# Patient Record
Sex: Male | Born: 1975 | Race: White | Hispanic: No | Marital: Single | Smoking: Former smoker
Health system: Southern US, Community
[De-identification: ages and names within clinical notes are randomized; demographics above are authoritative.]

## PROBLEM LIST (undated history)

## (undated) DIAGNOSIS — F419 Anxiety disorder, unspecified: Secondary | ICD-10-CM

## (undated) DIAGNOSIS — R000 Tachycardia, unspecified: Secondary | ICD-10-CM

## (undated) DIAGNOSIS — Z87442 Personal history of urinary calculi: Secondary | ICD-10-CM

## (undated) DIAGNOSIS — G4730 Sleep apnea, unspecified: Secondary | ICD-10-CM

---

## 2001-10-19 ENCOUNTER — Encounter: Payer: Self-pay | Admitting: Emergency Medicine

## 2001-10-19 ENCOUNTER — Emergency Department (HOSPITAL_COMMUNITY): Admission: EM | Admit: 2001-10-19 | Discharge: 2001-10-19 | Payer: Self-pay | Admitting: Emergency Medicine

## 2019-05-04 ENCOUNTER — Other Ambulatory Visit: Payer: Self-pay

## 2019-05-04 ENCOUNTER — Encounter (HOSPITAL_COMMUNITY): Payer: Self-pay | Admitting: Emergency Medicine

## 2019-05-04 ENCOUNTER — Emergency Department (HOSPITAL_COMMUNITY)
Admission: EM | Admit: 2019-05-04 | Discharge: 2019-05-04 | Disposition: A | Discharge status: Home / Self Care | Payer: Self-pay | Attending: Emergency Medicine | Admitting: Emergency Medicine

## 2019-05-04 ENCOUNTER — Emergency Department (HOSPITAL_COMMUNITY): Payer: Self-pay

## 2019-05-04 DIAGNOSIS — R109 Unspecified abdominal pain: Secondary | ICD-10-CM

## 2019-05-04 DIAGNOSIS — N201 Calculus of ureter: Secondary | ICD-10-CM | POA: Insufficient documentation

## 2019-05-04 DIAGNOSIS — Z87891 Personal history of nicotine dependence: Secondary | ICD-10-CM | POA: Insufficient documentation

## 2019-05-04 HISTORY — DX: Anxiety disorder, unspecified: F41.9

## 2019-05-04 LAB — URINALYSIS, ROUTINE W REFLEX MICROSCOPIC
Bilirubin Urine: NEGATIVE
Glucose, UA: NEGATIVE mg/dL
Ketones, ur: NEGATIVE mg/dL
Leukocytes,Ua: NEGATIVE
Nitrite: NEGATIVE
Protein, ur: NEGATIVE mg/dL
Specific Gravity, Urine: 1.015 (ref 1.005–1.030)
pH: 6 (ref 5.0–8.0)

## 2019-05-04 LAB — CBC
HCT: 47.4 % (ref 39.0–52.0)
Hemoglobin: 16.4 g/dL (ref 13.0–17.0)
MCH: 31.5 pg (ref 26.0–34.0)
MCHC: 34.6 g/dL (ref 30.0–36.0)
MCV: 91 fL (ref 80.0–100.0)
Platelets: 172 10*3/uL (ref 150–400)
RBC: 5.21 MIL/uL (ref 4.22–5.81)
RDW: 11.9 % (ref 11.5–15.5)
WBC: 4.4 10*3/uL (ref 4.0–10.5)
nRBC: 0 % (ref 0.0–0.2)

## 2019-05-04 LAB — COMPREHENSIVE METABOLIC PANEL
ALT: 57 U/L — ABNORMAL HIGH (ref 0–44)
AST: 32 U/L (ref 15–41)
Albumin: 4 g/dL (ref 3.5–5.0)
Alkaline Phosphatase: 52 U/L (ref 38–126)
Anion gap: 10 (ref 5–15)
BUN: 13 mg/dL (ref 6–20)
CO2: 25 mmol/L (ref 22–32)
Calcium: 9.2 mg/dL (ref 8.9–10.3)
Chloride: 104 mmol/L (ref 98–111)
Creatinine, Ser: 1.11 mg/dL (ref 0.61–1.24)
GFR calc Af Amer: >60 mL/min (ref >60)
GFR calc non Af Amer: >60 mL/min (ref >60)
Glucose, Bld: 119 mg/dL — ABNORMAL HIGH (ref 70–99)
Potassium: 4.1 mmol/L (ref 3.5–5.1)
Sodium: 139 mmol/L (ref 135–145)
Total Bilirubin: 1.7 mg/dL — ABNORMAL HIGH (ref 0.3–1.2)
Total Protein: 7.1 g/dL (ref 6.5–8.1)

## 2019-05-04 LAB — LIPASE, BLOOD: Lipase: 33 U/L (ref 11–51)

## 2019-05-04 MED ORDER — KETOROLAC TROMETHAMINE 30 MG/ML IJ SOLN
30.0000 mg | Freq: Once | INTRAMUSCULAR | Status: AC
  Administered 2019-05-04: 30 mg via INTRAVENOUS
  Filled 2019-05-04: qty 1

## 2019-05-04 MED ORDER — OXYCODONE-ACETAMINOPHEN 5-325 MG PO TABS: 1.0000 | ORAL_TABLET | Freq: Four times a day (QID) | ORAL | 0 refills | Status: DC | PRN

## 2019-05-04 MED ORDER — KETOROLAC TROMETHAMINE 10 MG PO TABS: 10.0000 mg | ORAL_TABLET | Freq: Four times a day (QID) | ORAL | 0 refills | Status: DC | PRN

## 2019-05-04 MED ORDER — TAMSULOSIN HCL 0.4 MG PO CAPS: 0.4000 mg | ORAL_CAPSULE | Freq: Every day | ORAL | 0 refills | Status: AC

## 2019-05-04 MED ORDER — SODIUM CHLORIDE 0.9% FLUSH: 3.0000 mL | Freq: Once | INTRAVENOUS | Status: DC

## 2019-05-04 NOTE — Discharge Instructions (Signed)
You have been seen in the Emergency Department (ED) today for pain that we believe based on your workup, is caused by kidney stones.  As we have discussed, please drink plenty of fluids.  Please make a follow up appointment with the physician(s) listed elsewhere in this documentation.  You may take pain medication as needed but ONLY as prescribed.  Please also take your prescribed Flomax daily.  We also recommend that you take over-the-counter ibuprofen regularly according to label instructions over the next 5 days.  Take it with meals to minimize stomach discomfort.  Please see your doctor as soon as possible as stones may take 1-3 weeks to pass and you may require additional care or medications.  Do not drink alcohol, drive or participate in any other potentially dangerous activities while taking opiate pain medication as it may make you sleepy. Do not take this medication with any other sedating medications, either prescription or over-the-counter. If you were prescribed Percocet or Vicodin, do not take these with acetaminophen (Tylenol) as it is already contained within these medications.   Take Percocet as needed for severe pain.  This medication is an opiate (or narcotic) pain medication and can be habit forming.  Use it as little as possible to achieve adequate pain control.  Do not use or use it with extreme caution if you have a history of opiate abuse or dependence. This medication is intended for your use only - do not give any to anyone else and keep it in a secure place where nobody else, especially children, have access to it.  It will also cause or worsen constipation, so you may want to consider taking an over-the-counter stool softener while you are taking this medication.  Return to the Emergency Department (ED) or call your doctor if you have any worsening pain, fever, painful urination, are unable to urinate, or develop other symptoms that concern you.    Kidney Stones Kidney  stones (urolithiasis) are deposits that form inside your kidneys. The intense pain is caused by the stone moving through the urinary tract. When the stone moves, the ureter goes into spasm around the stone. The stone is usually passed in the urine.  CAUSES  A disorder that makes certain neck glands produce too much parathyroid hormone (primary hyperparathyroidism). A buildup of uric acid crystals, similar to gout in your joints. Narrowing (stricture) of the ureter. A kidney obstruction present at birth (congenital obstruction). Previous surgery on the kidney or ureters. Numerous kidney infections. SYMPTOMS  Feeling sick to your stomach (nauseous). Throwing up (vomiting). Blood in the urine (hematuria). Pain that usually spreads (radiates) to the groin. Frequency or urgency of urination. DIAGNOSIS  Taking a history and physical exam. Blood or urine tests. CT scan. Occasionally, an examination of the inside of the urinary bladder (cystoscopy) is performed. TREATMENT  Observation. Increasing your fluid intake. Extracorporeal shock wave lithotripsy--This is a noninvasive procedure that uses shock waves to break up kidney stones. Surgery may be needed if you have severe pain or persistent obstruction. There are various surgical procedures. Most of the procedures are performed with the use of small instruments. Only small incisions are needed to accommodate these instruments, so recovery time is minimized. The size, location, and chemical composition are all important variables that will determine the proper choice of action for you. Talk to your health care provider to better understand your situation so that you will minimize the risk of injury to yourself and your kidney.  HOME CARE INSTRUCTIONS  Drink enough water and fluids to keep your urine clear or pale yellow. This will help you to pass the stone or stone fragments. Strain all urine through the provided strainer. Keep all particulate  matter and stones for your health care provider to see. The stone causing the pain may be as small as a grain of salt. It is very important to use the strainer each and every time you pass your urine. The collection of your stone will allow your health care provider to analyze it and verify that a stone has actually passed. The stone analysis will often identify what you can do to reduce the incidence of recurrences. Only take over-the-counter or prescription medicines for pain, discomfort, or fever as directed by your health care provider. Keep all follow-up visits as told by your health care provider. This is important. Get follow-up X-rays if required. The absence of pain does not always mean that the stone has passed. It may have only stopped moving. If the urine remains completely obstructed, it can cause loss of kidney function or even complete destruction of the kidney. It is your responsibility to make sure X-rays and follow-ups are completed. Ultrasounds of the kidney can show blockages and the status of the kidney. Ultrasounds are not associated with any radiation and can be performed easily in a matter of minutes. Make changes to your daily diet as told by your health care provider. You may be told to: Limit the amount of salt that you eat. Eat 5 or more servings of fruits and vegetables each day. Limit the amount of meat, poultry, fish, and eggs that you eat. Collect a 24-hour urine sample as told by your health care provider. You may need to collect another urine sample every 6-12 months. SEEK MEDICAL CARE IF: You experience pain that is progressive and unresponsive to any pain medicine you have been prescribed. SEEK IMMEDIATE MEDICAL CARE IF:  Pain cannot be controlled with the prescribed medicine. You have a fever or shaking chills. The severity or intensity of pain increases over 18 hours and is not relieved by pain medicine. You develop a new onset of abdominal pain. You feel faint  or pass out. You are unable to urinate.   This information is not intended to replace advice given to you by your health care provider. Make sure you discuss any questions you have with your health care provider.   Document Released: 07/06/2005 Document Revised: 03/27/2015 Document Reviewed: 12/07/2012 Elsevier Interactive Patient Education [employer].

## 2019-05-04 NOTE — ED Notes (Signed)
Returned From CT

## 2019-05-04 NOTE — ED Triage Notes (Signed)
Pt reports sudden onset of left flank pain about an hour ago. Never had this pain before. Denies recent fever, vomiting, diarrhea. NAD at present.

## 2019-05-04 NOTE — ED Notes (Signed)
Transported to CT 

## 2019-05-04 NOTE — ED Provider Notes (Signed)
Emergency Department Provider Note   I have reviewed the triage vital signs and the nursing notes.   HISTORY  Chief Complaint Flank Pain   HPI Edwin Chen is a 43 y.o. male with PMH of anxiety presents to the emergency department for evaluation of acute onset severe left flank pain.  Patient states he got up to urinate at which point he felt severe, sharp left flank pain.  He states that he never had a similar pain in the past.  He felt an uncomfortable sensation with attempting to urinate.  He has not had fevers or chills.  No vomiting or diarrhea.  Pain radiates from the left flank to the left lower abdomen.  No prior history of kidney stone.  He has not seen blood in the urine.  After about 1 hour of severe pain his pain decreased but continues to be moderate and intermittent in the same location.  Past Medical History:  Diagnosis Date   Anxiety     There are no active problems to display for this patient.   History reviewed. No pertinent surgical history.  Allergies Patient has no known allergies.  History reviewed. No pertinent family history.  Social History Social History   Tobacco Use   Smoking status: Former Smoker  Substance Use Topics   Alcohol use: Yes    Comment: occas   Drug use: Never    Review of Systems  Constitutional: No fever/chills Eyes: No visual changes. ENT: No sore throat. Cardiovascular: Denies chest pain. Respiratory: Denies shortness of breath. Gastrointestinal: Positive left flank/abdominal pain.  No nausea, no vomiting.  No diarrhea.  No constipation. Genitourinary: Positive for dysuria. Musculoskeletal: Negative for back pain. Skin: Negative for rash. Neurological: Negative for headaches, focal weakness or numbness.  10-point ROS otherwise negative.  ____________________________________________   PHYSICAL EXAM:  VITAL SIGNS: ED Triage Vitals  Enc Vitals Group     BP 05/04/19 0729 (!) 142/101     Pulse Rate  05/04/19 0729 80     Resp 05/04/19 0729 16     Temp 05/04/19 0729 98.4 F (36.9 C)     Temp Source 05/04/19 0729 Oral     SpO2 05/04/19 0729 100 %     Weight 05/04/19 0730 220 lb (99.8 kg)     Height 05/04/19 0730 5\' 8"  (1.727 m)   Constitutional: Alert and oriented. Well appearing and in no acute distress. Eyes: Conjunctivae are normal.  Head: Atraumatic. Nose: No congestion/rhinnorhea. Mouth/Throat: Mucous membranes are moist.  Neck: No stridor.  Cardiovascular: Normal rate, regular rhythm.  Respiratory: Normal respiratory effort.  Gastrointestinal: Soft and nontender. No distention.  Musculoskeletal: No gross deformities of extremities. Neurologic:  Normal speech and language.  Skin:  Skin is warm, dry and intact. No rash noted.  ____________________________________________   LABS (all labs ordered are listed, but only abnormal results are displayed)  Labs Reviewed  COMPREHENSIVE METABOLIC PANEL - Abnormal; Notable for the following components:      Result Value   Glucose, Bld 119 (*)    ALT 57 (*)    Total Bilirubin 1.7 (*)    All other components within normal limits  URINALYSIS, ROUTINE W REFLEX MICROSCOPIC - Abnormal; Notable for the following components:   Hgb urine dipstick LARGE (*)    Bacteria, UA RARE (*)    All other components within normal limits  LIPASE, BLOOD  CBC   ____________________________________________  RADIOLOGY  Ct Renal Stone Study  Result Date: 05/04/2019 CLINICAL DATA:  Left  flank pain EXAM: CT ABDOMEN AND PELVIS WITHOUT CONTRAST TECHNIQUE: Multidetector CT imaging of the abdomen and pelvis was performed following the standard protocol without oral or IV contrast. COMPARISON:  None. FINDINGS: Lower chest: Lung bases are clear. Hepatobiliary: There is hepatic steatosis. No focal liver lesions are evident on this noncontrast enhanced study. Gallbladder wall is not appreciably thickened. There is no biliary duct dilatation. Pancreas: There is  no pancreatic mass or inflammatory focus. Spleen: No splenic lesions are evident. There is a splenule anterior to the spleen. Adrenals/Urinary Tract: Adrenals bilaterally appear normal. There is no renal mass on either side. There is mild hydronephrosis on the left. There is no hydronephrosis on the right. There is no intrarenal calculus on either side. There is a 3 mm calculus in the distal left ureter at the inferior left acetabular level. No other ureteral calculi are evident on either side. Urinary bladder is midline with wall thickness within normal limits. Stomach/Bowel: There is no appreciable bowel wall or mesenteric thickening. There is no bowel obstruction. The terminal ileum appears normal. There is no free air or portal venous air. Vascular/Lymphatic: There is no abdominal aortic aneurysm. No vascular lesions are demonstrable on this noncontrast enhanced study. There is a circumaortic left renal vein, an anatomic variant. There is no adenopathy in the abdomen or pelvis. Reproductive: There are prostatic calculi. Prostate and seminal vesicles are normal in size and contour. There is no evident pelvic mass. Other: Appendix appears normal. There is no abscess or ascites in the abdomen or pelvis. There is fat in each inguinal ring. Musculoskeletal: There are no blastic or lytic bone lesions. There is no intramuscular lesion. IMPRESSION: 1. 3 mm calculus in the distal left ureter near the ureterovesical junction with slight hydronephrosis on the left. 2. No bowel obstruction. No abscess in the abdomen or pelvis. The appendix appears normal. 3.  There is hepatic steatosis. 4.  There are several prostatic calculi. Electronically Signed   By: Lowella Grip III M.D.   On: 05/04/2019 10:39    ____________________________________________   PROCEDURES  Procedure(s) performed:   Procedures  None  ____________________________________________   INITIAL IMPRESSION / ASSESSMENT AND PLAN / ED  COURSE  Pertinent labs & imaging results that were available during my care of the patient were reviewed by me and considered in my medical decision making (see chart for details).   Patient presents to the emergency department with acute onset left flank pain.  Clinically my suspicion for kidney stone is elevated.  Patient is low risk for vascular pathology.  He has no anterior abdominal tenderness to suspect abdominal surgical etiology.  Plan for CT renal, UA, pain control, reassess.  Patient with uncomplicated left kidney stone. [place] drug database reviewed. Patient's pain is well controlled in the ED. Referral placed for Urology. Contact info given. Plan for discharge. UA without sign of infection.  ____________________________________________  FINAL CLINICAL IMPRESSION(S) / ED DIAGNOSES  Final diagnoses:  Ureterolithiasis  Left flank pain     MEDICATIONS GIVEN DURING THIS VISIT:  Medications  ketorolac (TORADOL) 30 MG/ML injection 30 mg (30 mg Intravenous Given 05/04/19 0915)     NEW OUTPATIENT MEDICATIONS STARTED DURING THIS VISIT:  Discharge Medication List as of 05/04/2019 10:57 AM    START taking these medications   Details  ketorolac (TORADOL) 10 MG tablet Take 1 tablet (10 mg total) by mouth every 6 (six) hours as needed., Starting Thu 05/04/2019, Normal    oxyCODONE-acetaminophen (PERCOCET/ROXICET) 5-325 MG tablet Take 1  tablet by mouth every 6 (six) hours as needed for severe pain., Starting Thu 05/04/2019, Normal    tamsulosin (FLOMAX) 0.4 MG CAPS capsule Take 1 capsule (0.4 mg total) by mouth daily for 14 days., Starting Thu 05/04/2019, Until Thu 05/18/2019, Normal        Note:  This document was prepared using Dragon voice recognition software and may include unintentional dictation errors.  Alona Bene, MD, [facility] Emergency Medicine    Amnah Breuer, Arlyss Repress, MD 05/04/19 [phone]

## 2019-11-09 ENCOUNTER — Ambulatory Visit (INDEPENDENT_AMBULATORY_CARE_PROVIDER_SITE_OTHER): Payer: 59 | Admitting: Orthopaedic Surgery

## 2019-11-09 ENCOUNTER — Ambulatory Visit (INDEPENDENT_AMBULATORY_CARE_PROVIDER_SITE_OTHER): Payer: 59

## 2019-11-09 ENCOUNTER — Encounter: Payer: Self-pay | Admitting: Orthopaedic Surgery

## 2019-11-09 ENCOUNTER — Other Ambulatory Visit: Payer: Self-pay

## 2019-11-09 DIAGNOSIS — G8929 Other chronic pain: Secondary | ICD-10-CM | POA: Diagnosis not present

## 2019-11-09 DIAGNOSIS — M25562 Pain in left knee: Secondary | ICD-10-CM

## 2019-11-09 MED ORDER — BUPIVACAINE HCL 0.5 % IJ SOLN
2.0000 mL | INTRAMUSCULAR | Status: AC | PRN
  Administered 2019-11-09: 2 mL via INTRA_ARTICULAR

## 2019-11-09 MED ORDER — METHYLPREDNISOLONE ACETATE 40 MG/ML IJ SUSP
40.0000 mg | INTRAMUSCULAR | Status: AC | PRN
  Administered 2019-11-09: 40 mg via INTRA_ARTICULAR

## 2019-11-09 MED ORDER — LIDOCAINE HCL 1 % IJ SOLN
2.0000 mL | INTRAMUSCULAR | Status: AC | PRN
  Administered 2019-11-09: 2 mL

## 2019-11-09 NOTE — Progress Notes (Signed)
Office Visit Note   Patient: Edwin Chen           Date of Birth: 09/10/1975           MRN: [id number] Visit Date: 11/09/2019              Requested by: Clayborn Heron, MD [address] [place],  [place] [address] PCP: Patient, No Pcp Per   Assessment & Plan: Visit Diagnoses:  1. Chronic pain of left knee     Plan: Impression is left knee pain with effusion.  I aspirated approximately 30 cc of joint fluid and then injected his knee with cortisone today.  He tolerated this well.  He will take it easy for couple days and then increase activity as tolerated.  We discussed that should this recur we will need to then obtain MRI to rule out structural abnormality particularly a medial meniscal tear.  Otherwise we will see him back as needed.  Follow-Up Instructions: Return if symptoms worsen or fail to improve.   Orders:  Orders Placed This Encounter  Procedures  . XR KNEE 3 VIEW LEFT   No orders of the defined types were placed in this encounter.     Procedures: Large Joint Inj: L knee on 11/09/2019 10:15 AM Details: 22 G needle Medications: 2 mL bupivacaine 0.5 %; 2 mL lidocaine 1 %; 40 mg methylPREDNISolone acetate 40 MG/ML Outcome: tolerated well, no immediate complications Patient was prepped and draped in the usual sterile fashion.       Clinical Data: No additional findings.   Subjective: Chief Complaint  Patient presents with  . Left Knee - Pain    Fitzpatrick is a very pleasant 44 year old gentleman comes in for evaluation of chronic left knee pain and stiffness and swelling.  Overall this has been bothering him for several months.  He went to see his PCP yesterday and was referred here.  He denies a history of gout or injuries.  The pain is getting worse and the knee feels weak and wants to give way.  He feels pressure and tightness and pain on the medial side.  Denies any numbness and tingling.   Review of Systems  Constitutional: Negative.   All other  systems reviewed and are negative.    Objective: Vital Signs: There were no vitals taken for this visit.  Physical Exam Vitals and nursing note reviewed.  Constitutional:      Appearance: He is well-developed.  HENT:     Head: Normocephalic and atraumatic.  Eyes:     Pupils: Pupils are equal, round, and reactive to light.  Pulmonary:     Effort: Pulmonary effort is normal.  Abdominal:     Palpations: Abdomen is soft.  Musculoskeletal:        General: Normal range of motion.     Cervical back: Neck supple.  Skin:    General: Skin is warm.  Neurological:     Mental Status: He is alert and oriented to person, place, and time.  Psychiatric:        Behavior: Behavior normal.        Thought Content: Thought content normal.        Judgment: Judgment normal.     Ortho Exam Left knee shows a medium sized joint effusion.  Slight limitation range of motion secondary to the effusion.  Collaterals and cruciates are stable.  Slight medial joint line tenderness.  Negative McMurray. Specialty Comments:  No specialty comments available.  Imaging:  No results found.   PMFS History: There are no problems to display for this patient.  Past Medical History:  Diagnosis Date  . Anxiety     History reviewed. No pertinent family history.  History reviewed. No pertinent surgical history. Social History   Occupational History  . Not on file  Tobacco Use  . Smoking status: Former Research scientist (life sciences)  . Smokeless tobacco: Never Used  Substance and Sexual Activity  . Alcohol use: Yes    Comment: occas  . Drug use: Never  . Sexual activity: Yes    Birth control/protection: None

## 2019-11-09 NOTE — Addendum Note (Signed)
Addended by: Albertina Parr on: 11/09/2019 04:14 PM   Modules accepted: Orders

## 2019-11-10 ENCOUNTER — Telehealth: Payer: Self-pay

## 2019-11-10 ENCOUNTER — Telehealth: Payer: Self-pay | Admitting: Orthopaedic Surgery

## 2019-11-10 LAB — SYNOVIAL CELL COUNT + DIFF, W/ CRYSTALS
Basophils, %: 0 %
Eosinophils-Synovial: 0 % (ref 0–2)
Lymphocytes-Synovial Fld: 33 % (ref 0–74)
Monocyte/Macrophage: 60 % (ref 0–69)
Neutrophil, Synovial: 1 % (ref 0–24)
Synoviocytes, %: 6 % (ref 0–15)
WBC, Synovial: 397 cells/uL — ABNORMAL HIGH (ref <150)

## 2019-11-10 LAB — TIQ-NTM

## 2019-11-10 NOTE — Telephone Encounter (Signed)
Called patient no answer. LMOM. Need to advise on message below.    Tarry Kos, MD  Albertina Parr, RMA  Fluid just shows some inflammation. No infection or gout.

## 2019-11-10 NOTE — Telephone Encounter (Signed)
Patient returned call and chelsea advised

## 2019-11-10 NOTE — Telephone Encounter (Signed)
That's was all that was needed to be relayed. Thanks.

## 2019-11-10 NOTE — Progress Notes (Signed)
Fluid just shows some inflammation.  No infection or gout.

## 2019-11-10 NOTE — Telephone Encounter (Signed)
Patient called.   He was returning the call made to him earlier. I shared the message from Roda Shutters that was in his chart. Im not sure if that was all that needed to be shared though. If there was more to inform him of, please give him a call back.   Call back: [phone]

## 2019-11-21 ENCOUNTER — Other Ambulatory Visit: Payer: Self-pay

## 2019-11-21 ENCOUNTER — Ambulatory Visit (INDEPENDENT_AMBULATORY_CARE_PROVIDER_SITE_OTHER): Payer: 59 | Admitting: Orthopaedic Surgery

## 2019-11-21 ENCOUNTER — Encounter: Payer: Self-pay | Admitting: Orthopaedic Surgery

## 2019-11-21 VITALS — Ht 68.0 in | Wt 220.0 lb

## 2019-11-21 DIAGNOSIS — G8929 Other chronic pain: Secondary | ICD-10-CM | POA: Diagnosis not present

## 2019-11-21 DIAGNOSIS — M25562 Pain in left knee: Secondary | ICD-10-CM

## 2019-11-21 NOTE — Progress Notes (Signed)
   Office Visit Note   Patient: Edwin Chen           Date of Birth: 01/01/1976           MRN: [id number] Visit Date: 11/21/2019              Requested by: No referring provider defined for this encounter. PCP: Patient, No Pcp Per   Assessment & Plan: Visit Diagnoses:  1. Chronic pain of left knee     Plan: At this point given the recurrence of the effusion and continued medial symptoms concerning for a medial meniscal tear I have ordered an MRI to evaluate for structural abnormalities.  He continues to have catching and locking symptoms.  Follow-up to review the MRI.  Follow-Up Instructions: Return for 10-14 days to review MRI.   Orders:  Orders Placed This Encounter  Procedures  . MR Knee Left w/o contrast   No orders of the defined types were placed in this encounter.     Procedures: No procedures performed   Clinical Data: No additional findings.   Subjective: Chief Complaint  Patient presents with  . Left Knee - Edema    [name] returns today for continued left knee pain.  He feels like the fluid has returned.   Review of Systems  Constitutional: Negative.   All other systems reviewed and are negative.    Objective: Vital Signs: Ht 5\' 8"  (1.727 m)   Wt 220 lb (99.8 kg)   BMI 33.45 kg/m   Physical Exam Vitals and nursing note reviewed.  Constitutional:      Appearance: He is well-developed.  Pulmonary:     Effort: Pulmonary effort is normal.  Abdominal:     Palpations: Abdomen is soft.  Skin:    General: Skin is warm.  Neurological:     Mental Status: He is alert and oriented to person, place, and time.  Psychiatric:        Behavior: Behavior normal.        Thought Content: Thought content normal.        Judgment: Judgment normal.     Ortho Exam Left knee shows a small joint effusion.  Significant medial joint line tenderness. Specialty Comments:  No specialty comments available.  Imaging: No results found.   PMFS History: There  are no problems to display for this patient.  Past Medical History:  Diagnosis Date  . Anxiety     No family history on file.  No past surgical history on file. Social History   Occupational History  . Not on file  Tobacco Use  . Smoking status: Former  . Smokeless tobacco: Never Used  Substance and Sexual Activity  . Alcohol use: Yes    Comment: occas  . Drug use: Never  . Sexual activity: Yes    Birth control/protection: None

## 2019-12-05 ENCOUNTER — Ambulatory Visit: Payer: 59 | Admitting: Orthopaedic Surgery

## 2019-12-11 ENCOUNTER — Ambulatory Visit
Admission: RE | Admit: 2019-12-11 | Discharge: 2019-12-11 | Disposition: A | Discharge status: Home / Self Care | Payer: 59 | Source: Ambulatory Visit | Attending: Orthopaedic Surgery | Admitting: Orthopaedic Surgery

## 2019-12-11 DIAGNOSIS — G8929 Other chronic pain: Secondary | ICD-10-CM

## 2019-12-13 ENCOUNTER — Encounter: Payer: Self-pay | Admitting: Orthopaedic Surgery

## 2019-12-13 ENCOUNTER — Ambulatory Visit (INDEPENDENT_AMBULATORY_CARE_PROVIDER_SITE_OTHER): Payer: 59 | Admitting: Orthopaedic Surgery

## 2019-12-13 ENCOUNTER — Other Ambulatory Visit: Payer: Self-pay

## 2019-12-13 VITALS — Ht 67.5 in | Wt 227.8 lb

## 2019-12-13 DIAGNOSIS — S83242A Other tear of medial meniscus, current injury, left knee, initial encounter: Secondary | ICD-10-CM | POA: Diagnosis not present

## 2019-12-13 MED ORDER — LIDOCAINE HCL 1 % IJ SOLN
2.0000 mL | INTRAMUSCULAR | Status: AC | PRN
  Administered 2019-12-13: 2 mL

## 2019-12-13 MED ORDER — BUPIVACAINE HCL 0.5 % IJ SOLN
2.0000 mL | INTRAMUSCULAR | Status: AC | PRN
  Administered 2019-12-13: 2 mL via INTRA_ARTICULAR

## 2019-12-13 NOTE — Progress Notes (Signed)
   Office Visit Note   Patient: Edwin Chen           Date of Birth: February 18, 1976           MRN: [id number] Visit Date: 12/13/2019              Requested by: No referring provider defined for this encounter. PCP: Patient, No Pcp Per   Assessment & Plan: Visit Diagnoses:  1. Acute medial meniscus tear of left knee, initial encounter     Plan: MRI of the left knee shows a complex tear of the medial meniscus with a small amount of bony reactive edema in the medial tibial plateau.  These findings were reviewed with the patient and given the lack of relief from conservative treatment I have recommended partial medial meniscectomy.  Based on our discussion of risk benefits rehab recovery and alternatives to surgery he has elected to proceed with a knee scope and partial medial meniscectomy.  Questions encouraged and answered.  I aspirated approximately 10 cc of joint fluid from his knee today and injected Toradol for pain relief.  Follow-Up Instructions: No follow-ups on file.   Orders:  No orders of the defined types were placed in this encounter.  No orders of the defined types were placed in this encounter.     Procedures: Large Joint Inj: L knee on 12/13/2019 3:27 PM Details: 22 G needle Medications: 2 mL bupivacaine 0.5 %; 2 mL lidocaine 1 % Outcome: tolerated well, no immediate complications Patient was prepped and draped in the usual sterile fashion.       Clinical Data: No additional findings.   Subjective: Chief Complaint  Patient presents with  . Left Knee - Pain    Vondell returns today for MRI review of the left knee.  No changes.   Review of Systems  Constitutional: Negative.   All other systems reviewed and are negative.    Objective: Vital Signs: There were no vitals taken for this visit.  Physical Exam Vitals and nursing note reviewed.  Constitutional:      Appearance: He is well-developed.  Pulmonary:     Effort: Pulmonary effort is normal.    Abdominal:     Palpations: Abdomen is soft.  Skin:    General: Skin is warm.  Neurological:     Mental Status: He is alert and oriented to person, place, and time.  Psychiatric:        Behavior: Behavior normal.        Thought Content: Thought content normal.        Judgment: Judgment normal.     Ortho Exam Left knee shows small joint effusion.  Medial joint line tenderness. Specialty Comments:  No specialty comments available.  Imaging: No results found.   PMFS History: Patient Active Problem List   Diagnosis Date Noted  . Acute medial meniscus tear of left knee 12/13/2019   Past Medical History:  Diagnosis Date  . Anxiety     No family history on file.  No past surgical history on file. Social History   Occupational History  . Not on file  Tobacco Use  . Smoking status: Former Games developer  . Smokeless tobacco: Never Used  Substance and Sexual Activity  . Alcohol use: Yes    Comment: occas  . Drug use: Never  . Sexual activity: Yes    Birth control/protection: None

## 2019-12-14 ENCOUNTER — Other Ambulatory Visit: Payer: Self-pay

## 2019-12-19 ENCOUNTER — Other Ambulatory Visit: Payer: Self-pay | Admitting: Family

## 2019-12-23 ENCOUNTER — Other Ambulatory Visit (HOSPITAL_COMMUNITY): Payer: 59

## 2020-01-02 ENCOUNTER — Encounter (HOSPITAL_BASED_OUTPATIENT_CLINIC_OR_DEPARTMENT_OTHER): Payer: Self-pay | Admitting: Orthopaedic Surgery

## 2020-01-02 ENCOUNTER — Other Ambulatory Visit: Payer: Self-pay

## 2020-01-03 ENCOUNTER — Inpatient Hospital Stay: Payer: 59 | Admitting: Orthopaedic Surgery

## 2020-01-05 ENCOUNTER — Encounter (HOSPITAL_BASED_OUTPATIENT_CLINIC_OR_DEPARTMENT_OTHER)
Admission: RE | Admit: 2020-01-05 | Discharge: 2020-01-05 | Disposition: A | Discharge status: Home / Self Care | Payer: 59 | Source: Ambulatory Visit | Attending: Orthopaedic Surgery | Admitting: Orthopaedic Surgery

## 2020-01-05 DIAGNOSIS — Z6835 Body mass index (BMI) 35.0-35.9, adult: Secondary | ICD-10-CM | POA: Diagnosis not present

## 2020-01-05 DIAGNOSIS — E669 Obesity, unspecified: Secondary | ICD-10-CM | POA: Diagnosis not present

## 2020-01-05 DIAGNOSIS — Z79899 Other long term (current) drug therapy: Secondary | ICD-10-CM | POA: Diagnosis not present

## 2020-01-05 DIAGNOSIS — Z87442 Personal history of urinary calculi: Secondary | ICD-10-CM | POA: Diagnosis not present

## 2020-01-05 DIAGNOSIS — X58XXXA Exposure to other specified factors, initial encounter: Secondary | ICD-10-CM | POA: Diagnosis not present

## 2020-01-05 DIAGNOSIS — G4730 Sleep apnea, unspecified: Secondary | ICD-10-CM | POA: Diagnosis not present

## 2020-01-05 DIAGNOSIS — S8392XA Sprain of unspecified site of left knee, initial encounter: Secondary | ICD-10-CM | POA: Diagnosis not present

## 2020-01-05 DIAGNOSIS — Z87891 Personal history of nicotine dependence: Secondary | ICD-10-CM | POA: Diagnosis not present

## 2020-01-05 DIAGNOSIS — S83242A Other tear of medial meniscus, current injury, left knee, initial encounter: Secondary | ICD-10-CM | POA: Diagnosis present

## 2020-01-05 DIAGNOSIS — F419 Anxiety disorder, unspecified: Secondary | ICD-10-CM | POA: Diagnosis not present

## 2020-01-05 DIAGNOSIS — R9431 Abnormal electrocardiogram [ECG] [EKG]: Secondary | ICD-10-CM | POA: Diagnosis not present

## 2020-01-05 NOTE — Progress Notes (Signed)

## 2020-01-06 ENCOUNTER — Other Ambulatory Visit (HOSPITAL_COMMUNITY)
Admission: RE | Admit: 2020-01-06 | Discharge: 2020-01-06 | Disposition: A | Discharge status: Home / Self Care | Payer: 59 | Source: Ambulatory Visit | Attending: Orthopaedic Surgery | Admitting: Orthopaedic Surgery

## 2020-01-06 DIAGNOSIS — Z01812 Encounter for preprocedural laboratory examination: Secondary | ICD-10-CM | POA: Diagnosis present

## 2020-01-06 DIAGNOSIS — Z20822 Contact with and (suspected) exposure to covid-19: Secondary | ICD-10-CM | POA: Diagnosis not present

## 2020-01-06 LAB — SARS CORONAVIRUS 2 (TAT 6-24 HRS): SARS Coronavirus 2: NEGATIVE

## 2020-01-09 NOTE — Anesthesia Preprocedure Evaluation (Addendum)
Anesthesia Evaluation  Patient identified by MRN, date of birth, ID band Patient awake    Reviewed: Allergy & Precautions, NPO status , Patient's Chart, lab work & pertinent test results, reviewed documented beta blocker date and time , Unable to perform ROS - Chart review only  Airway Mallampati: II  TM Distance: >3 FB Neck ROM: Full    Dental  (+) Dental Advisory Given, Teeth Intact   Pulmonary sleep apnea , former smoker,    Pulmonary exam normal breath sounds clear to auscultation       Cardiovascular Pt. on home beta blockers Normal cardiovascular exam+ dysrhythmias Supra Ventricular Tachycardia  Rhythm:Regular Rate:Normal     Neuro/Psych PSYCHIATRIC DISORDERS Anxiety negative neurological ROS     GI/Hepatic negative GI ROS, Neg liver ROS,   Endo/Other  negative endocrine ROS  Renal/GU negative Renal ROS     Musculoskeletal negative musculoskeletal ROS (+)   Abdominal (+) + obese,   Peds  Hematology negative hematology ROS (+)   Anesthesia Other Findings   Reproductive/Obstetrics                            Anesthesia Physical Anesthesia Plan  ASA: II  Anesthesia Plan: General   Post-op Pain Management:    Induction: Intravenous  PONV Risk Score and Plan: 2 and Ondansetron, Dexamethasone and Treatment may vary due to age or medical condition  Airway Management Planned: LMA  Additional Equipment: None  Intra-op Plan:   Post-operative Plan: Extubation in OR  Informed Consent: I have reviewed the patients History and Physical, chart, labs and discussed the procedure including the risks, benefits and alternatives for the proposed anesthesia with the patient or authorized representative who has indicated his/her understanding and acceptance.     Dental advisory given  Plan Discussed with: CRNA  Anesthesia Plan Comments:        Anesthesia Quick Evaluation

## 2020-01-10 ENCOUNTER — Encounter: Payer: Self-pay | Admitting: Orthopaedic Surgery

## 2020-01-10 ENCOUNTER — Ambulatory Visit (HOSPITAL_BASED_OUTPATIENT_CLINIC_OR_DEPARTMENT_OTHER): Payer: 59 | Admitting: Anesthesiology

## 2020-01-10 ENCOUNTER — Other Ambulatory Visit: Payer: Self-pay

## 2020-01-10 ENCOUNTER — Encounter (HOSPITAL_BASED_OUTPATIENT_CLINIC_OR_DEPARTMENT_OTHER)
Admission: RE | Disposition: A | Discharge status: Home / Self Care | Payer: Self-pay | Source: Home / Self Care | Attending: Orthopaedic Surgery

## 2020-01-10 ENCOUNTER — Ambulatory Visit (HOSPITAL_BASED_OUTPATIENT_CLINIC_OR_DEPARTMENT_OTHER)
Admission: RE | Admit: 2020-01-10 | Discharge: 2020-01-10 | Disposition: A | Discharge status: Home / Self Care | Payer: 59 | Attending: Orthopaedic Surgery | Admitting: Orthopaedic Surgery

## 2020-01-10 ENCOUNTER — Encounter (HOSPITAL_BASED_OUTPATIENT_CLINIC_OR_DEPARTMENT_OTHER): Payer: Self-pay | Admitting: Orthopaedic Surgery

## 2020-01-10 DIAGNOSIS — G4730 Sleep apnea, unspecified: Secondary | ICD-10-CM | POA: Insufficient documentation

## 2020-01-10 DIAGNOSIS — S83242A Other tear of medial meniscus, current injury, left knee, initial encounter: Secondary | ICD-10-CM | POA: Diagnosis not present

## 2020-01-10 DIAGNOSIS — M659 Synovitis and tenosynovitis, unspecified: Secondary | ICD-10-CM | POA: Diagnosis not present

## 2020-01-10 DIAGNOSIS — Z6835 Body mass index (BMI) 35.0-35.9, adult: Secondary | ICD-10-CM | POA: Insufficient documentation

## 2020-01-10 DIAGNOSIS — S8392XA Sprain of unspecified site of left knee, initial encounter: Secondary | ICD-10-CM | POA: Diagnosis not present

## 2020-01-10 DIAGNOSIS — Z79899 Other long term (current) drug therapy: Secondary | ICD-10-CM | POA: Insufficient documentation

## 2020-01-10 DIAGNOSIS — E669 Obesity, unspecified: Secondary | ICD-10-CM | POA: Insufficient documentation

## 2020-01-10 DIAGNOSIS — Z87442 Personal history of urinary calculi: Secondary | ICD-10-CM | POA: Insufficient documentation

## 2020-01-10 DIAGNOSIS — R9431 Abnormal electrocardiogram [ECG] [EKG]: Secondary | ICD-10-CM | POA: Insufficient documentation

## 2020-01-10 DIAGNOSIS — F419 Anxiety disorder, unspecified: Secondary | ICD-10-CM | POA: Insufficient documentation

## 2020-01-10 DIAGNOSIS — Z87891 Personal history of nicotine dependence: Secondary | ICD-10-CM | POA: Insufficient documentation

## 2020-01-10 DIAGNOSIS — X58XXXA Exposure to other specified factors, initial encounter: Secondary | ICD-10-CM | POA: Insufficient documentation

## 2020-01-10 HISTORY — DX: Tachycardia, unspecified: R00.0

## 2020-01-10 HISTORY — DX: Personal history of urinary calculi: Z87.442

## 2020-01-10 HISTORY — DX: Sleep apnea, unspecified: G47.30

## 2020-01-10 HISTORY — PX: KNEE ARTHROSCOPY WITH MEDIAL MENISECTOMY: SHX5651

## 2020-01-10 SURGERY — ARTHROSCOPY, KNEE, WITH MEDIAL MENISCECTOMY
Anesthesia: General | Site: Knee | Laterality: Left

## 2020-01-10 MED ORDER — CHLORHEXIDINE GLUCONATE 4 % EX LIQD: Freq: Once | CUTANEOUS | Status: DC

## 2020-01-10 MED ORDER — CEFAZOLIN SODIUM-DEXTROSE 2-3 GM-%(50ML) IV SOLR
INTRAVENOUS | Status: DC | PRN
  Administered 2020-01-10: 2 g via INTRAVENOUS

## 2020-01-10 MED ORDER — BUPIVACAINE HCL (PF) 0.25 % IJ SOLN
INTRAMUSCULAR | Status: AC
  Filled 2020-01-10: qty 30

## 2020-01-10 MED ORDER — CELECOXIB 200 MG PO CAPS
200.0000 mg | ORAL_CAPSULE | Freq: Once | ORAL | Status: AC
  Administered 2020-01-10: 200 mg via ORAL

## 2020-01-10 MED ORDER — SODIUM CHLORIDE 0.9 % IR SOLN
Status: DC | PRN
  Administered 2020-01-10: 2000 mL

## 2020-01-10 MED ORDER — FENTANYL CITRATE (PF) 100 MCG/2ML IJ SOLN: 25.0000 ug | INTRAMUSCULAR | Status: DC | PRN

## 2020-01-10 MED ORDER — OXYCODONE HCL 5 MG/5ML PO SOLN: 5.0000 mg | Freq: Once | ORAL | Status: DC | PRN

## 2020-01-10 MED ORDER — LACTATED RINGERS IV SOLN: INTRAVENOUS | Status: DC | PRN

## 2020-01-10 MED ORDER — ACETAMINOPHEN 500 MG PO TABS
ORAL_TABLET | ORAL | Status: AC
  Filled 2020-01-10: qty 2

## 2020-01-10 MED ORDER — CELECOXIB 200 MG PO CAPS
ORAL_CAPSULE | ORAL | Status: AC
  Filled 2020-01-10: qty 1

## 2020-01-10 MED ORDER — CEFAZOLIN SODIUM-DEXTROSE 2-4 GM/100ML-% IV SOLN
INTRAVENOUS | Status: AC
  Filled 2020-01-10: qty 100

## 2020-01-10 MED ORDER — KETOROLAC TROMETHAMINE 30 MG/ML IJ SOLN
INTRAMUSCULAR | Status: DC | PRN
  Administered 2020-01-10: 30 mg via INTRAVENOUS

## 2020-01-10 MED ORDER — ACETAMINOPHEN 500 MG PO TABS
1000.0000 mg | ORAL_TABLET | Freq: Once | ORAL | Status: AC
  Administered 2020-01-10: 1000 mg via ORAL

## 2020-01-10 MED ORDER — ONDANSETRON HCL 4 MG/2ML IJ SOLN
INTRAMUSCULAR | Status: AC
  Filled 2020-01-10: qty 2

## 2020-01-10 MED ORDER — LIDOCAINE 2% (20 MG/ML) 5 ML SYRINGE
INTRAMUSCULAR | Status: AC
  Filled 2020-01-10: qty 5

## 2020-01-10 MED ORDER — BUPIVACAINE HCL (PF) 0.25 % IJ SOLN
INTRAMUSCULAR | Status: DC | PRN
  Administered 2020-01-10: 20 mL

## 2020-01-10 MED ORDER — FENTANYL CITRATE (PF) 100 MCG/2ML IJ SOLN
INTRAMUSCULAR | Status: AC
  Filled 2020-01-10: qty 2

## 2020-01-10 MED ORDER — MIDAZOLAM HCL 2 MG/2ML IJ SOLN
INTRAMUSCULAR | Status: DC | PRN
  Administered 2020-01-10: 2 mg via INTRAVENOUS

## 2020-01-10 MED ORDER — PROMETHAZINE HCL 25 MG/ML IJ SOLN: 6.2500 mg | INTRAMUSCULAR | Status: DC | PRN

## 2020-01-10 MED ORDER — ONDANSETRON HCL 4 MG/2ML IJ SOLN
INTRAMUSCULAR | Status: DC | PRN
  Administered 2020-01-10: 4 mg via INTRAVENOUS

## 2020-01-10 MED ORDER — FENTANYL CITRATE (PF) 100 MCG/2ML IJ SOLN
INTRAMUSCULAR | Status: DC | PRN
  Administered 2020-01-10: 50 ug via INTRAVENOUS

## 2020-01-10 MED ORDER — DEXAMETHASONE SODIUM PHOSPHATE 10 MG/ML IJ SOLN
INTRAMUSCULAR | Status: AC
  Filled 2020-01-10: qty 1

## 2020-01-10 MED ORDER — OXYCODONE HCL 5 MG PO TABS: 5.0000 mg | ORAL_TABLET | Freq: Once | ORAL | Status: DC | PRN

## 2020-01-10 MED ORDER — DEXAMETHASONE SODIUM PHOSPHATE 10 MG/ML IJ SOLN
INTRAMUSCULAR | Status: DC | PRN
  Administered 2020-01-10: 5 mg via INTRAVENOUS

## 2020-01-10 MED ORDER — LACTATED RINGERS IV SOLN: INTRAVENOUS | Status: DC

## 2020-01-10 MED ORDER — LIDOCAINE HCL (CARDIAC) PF 100 MG/5ML IV SOSY
PREFILLED_SYRINGE | INTRAVENOUS | Status: DC | PRN
Start: 2020-01-10 — End: 2020-01-10
  Administered 2020-01-10: 100 mg via INTRATRACHEAL

## 2020-01-10 MED ORDER — HYDROCODONE-ACETAMINOPHEN 5-325 MG PO TABS: 1.0000 | ORAL_TABLET | Freq: Three times a day (TID) | ORAL | 0 refills | Status: DC | PRN

## 2020-01-10 MED ORDER — PROPOFOL 10 MG/ML IV BOLUS
INTRAVENOUS | Status: AC
  Filled 2020-01-10: qty 40

## 2020-01-10 MED ORDER — PROPOFOL 10 MG/ML IV BOLUS
INTRAVENOUS | Status: DC | PRN
Start: 2020-01-10 — End: 2020-01-10
  Administered 2020-01-10: 250 mg via INTRAVENOUS

## 2020-01-10 MED ORDER — MIDAZOLAM HCL 2 MG/2ML IJ SOLN
INTRAMUSCULAR | Status: AC
  Filled 2020-01-10: qty 2

## 2020-01-10 MED ORDER — MEPERIDINE HCL 25 MG/ML IJ SOLN: 6.2500 mg | INTRAMUSCULAR | Status: DC | PRN

## 2020-01-10 MED ORDER — ONDANSETRON HCL 4 MG PO TABS: 4.0000 mg | ORAL_TABLET | Freq: Three times a day (TID) | ORAL | 0 refills | Status: DC | PRN

## 2020-01-10 MED ORDER — CEFAZOLIN SODIUM-DEXTROSE 2-4 GM/100ML-% IV SOLN: 2.0000 g | INTRAVENOUS | Status: DC

## 2020-01-10 SURGICAL SUPPLY — 35 items
BANDAGE ESMARK 6X9 LF (GAUZE/BANDAGES/DRESSINGS) IMPLANT
BLADE EXCALIBUR 4.0X13 (MISCELLANEOUS) ×1 IMPLANT
BLADE SHAVER TORPEDO 4X13 (MISCELLANEOUS) IMPLANT
BNDG CMPR 9X6 STRL LF SNTH (GAUZE/BANDAGES/DRESSINGS)
BNDG ELASTIC 6X5.8 VLCR STR LF (GAUZE/BANDAGES/DRESSINGS) ×4 IMPLANT
BNDG ESMARK 6X9 LF (GAUZE/BANDAGES/DRESSINGS)
COVER WAND RF STERILE (DRAPES) IMPLANT
CUFF TOURN SGL QUICK 34 (TOURNIQUET CUFF) ×2
CUFF TRNQT CYL 34X4.125X (TOURNIQUET CUFF) ×1 IMPLANT
DRAPE ARTHROSCOPY W/POUCH 90 (DRAPES) ×2 IMPLANT
DRAPE IMP U-DRAPE 54X76 (DRAPES) ×2 IMPLANT
DRAPE U-SHAPE 47X51 STRL (DRAPES) ×2 IMPLANT
DURAPREP 26ML APPLICATOR (WOUND CARE) ×2 IMPLANT
GAUZE SPONGE 4X4 12PLY STRL (GAUZE/BANDAGES/DRESSINGS) ×2 IMPLANT
GAUZE XEROFORM 1X8 LF (GAUZE/BANDAGES/DRESSINGS) ×2 IMPLANT
GLOVE BIOGEL PI IND STRL 7.0 (GLOVE) ×1 IMPLANT
GLOVE BIOGEL PI INDICATOR 7.0 (GLOVE) ×1
GLOVE ECLIPSE 7.0 STRL STRAW (GLOVE) ×2 IMPLANT
GLOVE SKINSENSE NS SZ7.5 (GLOVE) ×1
GLOVE SKINSENSE STRL SZ7.5 (GLOVE) ×1 IMPLANT
GLOVE SURG SYN 7.5  E (GLOVE) ×2
GLOVE SURG SYN 7.5 E (GLOVE) ×1 IMPLANT
GLOVE SURG SYN 7.5 PF PI (GLOVE) ×1 IMPLANT
GOWN STRL REIN XL XLG (GOWN DISPOSABLE) ×2 IMPLANT
GOWN STRL REUS W/ TWL LRG LVL3 (GOWN DISPOSABLE) ×1 IMPLANT
GOWN STRL REUS W/ TWL XL LVL3 (GOWN DISPOSABLE) ×1 IMPLANT
GOWN STRL REUS W/TWL LRG LVL3 (GOWN DISPOSABLE) ×2
GOWN STRL REUS W/TWL XL LVL3 (GOWN DISPOSABLE) ×2
KNEE WRAP E Z 3 GEL PACK (MISCELLANEOUS) ×2 IMPLANT
MANIFOLD NEPTUNE II (INSTRUMENTS) ×2 IMPLANT
PACK DSU ARTHROSCOPY (CUSTOM PROCEDURE TRAY) ×2 IMPLANT
SET BASIN DAY SURGERY F.S. (CUSTOM PROCEDURE TRAY) ×2 IMPLANT
SUT ETHILON 3 0 PS 1 (SUTURE) ×2 IMPLANT
TOWEL GREEN STERILE FF (TOWEL DISPOSABLE) ×2 IMPLANT
TUBING ARTHROSCOPY IRRIG 16FT (MISCELLANEOUS) ×2 IMPLANT

## 2020-01-10 NOTE — Anesthesia Procedure Notes (Signed)
Procedure Name: LMA Insertion Performed by: Tonie Vizcarrondo M, CRNA Pre-anesthesia Checklist: Patient identified, Emergency Drugs available, Suction available and Patient being monitored Patient Re-evaluated:Patient Re-evaluated prior to induction Oxygen Delivery Method: Circle system utilized Preoxygenation: Pre-oxygenation with 100% oxygen Induction Type: IV induction Ventilation: Mask ventilation without difficulty LMA: LMA inserted LMA Size: 5.0 Number of attempts: 1 Airway Equipment and Method: Bite block Placement Confirmation: positive ETCO2,  CO2 detector and breath sounds checked- equal and bilateral Tube secured with: Tape Dental Injury: Teeth and Oropharynx as per pre-operative assessment        

## 2020-01-10 NOTE — Anesthesia Postprocedure Evaluation (Signed)
Anesthesia Post Note  Patient: Edwin Chen  Procedure(s) Performed: LEFT KNEE ARTHROSCOPY WITH PARTIAL MEDIAL MENISCECTOMY (Left Knee)     Patient location during evaluation: PACU Anesthesia Type: General Level of consciousness: sedated and patient cooperative Pain management: pain level controlled Vital Signs Assessment: post-procedure vital signs reviewed and stable Respiratory status: spontaneous breathing Cardiovascular status: stable Anesthetic complications: no   No complications documented.  Last Vitals:  Vitals:   01/10/20 0900 01/10/20 0945  BP: 120/76 128/86  Pulse: 78 80  Resp: 14 16  Temp:  36.8 C  SpO2: 95% 94%    Last Pain:  Vitals:   01/10/20 0945  TempSrc:   PainSc: 4                  Lewie Loron

## 2020-01-10 NOTE — Op Note (Signed)
   Surgery Date: 01/10/2020  PREOPERATIVE DIAGNOSES:  1. Left knee medial meniscus tear 2. Left knee synovitis  POSTOPERATIVE DIAGNOSES:  same  PROCEDURES PERFORMED:  1. Left knee arthroscopy with major synovectomy 2. Left knee arthroscopy with arthroscopic partial medial meniscectomy 3. Left knee arthroscopy with arthroscopic chondroplasty medial femoral condyle  SURGEON: N. Glee Arvin, M.D.  ASSIST: None  ANESTHESIA:  general  FLUIDS: Per anesthesia record.   ESTIMATED BLOOD LOSS: minimal  DESCRIPTION OF PROCEDURE: Edwin Chen is a 44 y.o.-year-old male with left knee medial meniscus tear. Plans are to proceed with partial medial meniscectomy and diagnostic arthroscopy with debridement as indicated. Full discussion held regarding risks benefits alternatives and complications related surgical intervention. Conservative care options reviewed. All questions answered.  The patient was identified in the preoperative holding area and the operative extremity was marked. The patient was brought to the operating room and transferred to operating table in a supine position. Satisfactory general anesthesia was induced by anesthesiology.    Standard anterolateral, anteromedial arthroscopy portals were obtained. The anteromedial portal was obtained with a spinal needle for localization under direct visualization with subsequent diagnostic findings.   Incisions were made for standard knee arthroscopy portals.  Diagnostic knee arthroscopy was first performed.  He had moderate synovitis in all 3 compartments that was inflamed and friable to gentle touch.  A major synovectomy was performed in all 3 compartments using oscillating shaver.  We then placed the arthroscope in the medial compartment with a gentle valgus force we were able to identify the complex tear the posterior horn the medial meniscus that extended to the meniscal root.  The root itself was probed and determined to be intact.  Partial  medial meniscectomy was then performed using a meniscus basket and oscillating shaver back to a stable border.  We took approximately 30% of the volume of the medial meniscus and the torn section.  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  The cruciates were unremarkable.  The lateral compartment was unremarkable.  The patellofemoral compartment demonstrated grade I chondromalacia.  Gutters were checked for loose bodies.  Excess fluid was removed from the knee joint.  Incisions were closed with interrupted nylon sutures.  Sterile dressings were applied.  Patient tolerated procedure well had no immediate complications.  Suprapatellar pouch and gutters: moderate synovitis or debris. Patella chondral surface: Grade 1 Trochlear chondral surface: Grade 1 Patellofemoral tracking: Slightly lateral Medial meniscus: Complex tear posterior horn.  Medial femoral condyle weight bearing surface: Grade 2 Medial tibial plateau: Grade 1 Anterior cruciate ligament:stable Posterior cruciate ligament:stable Lateral meniscus: Normal.   Lateral femoral condyle weight bearing surface: Grade 0 Lateral tibial plateau: Grade 0  DISPOSITION: The patient was awakened from general anesthetic, extubated, taken to the recovery room in medically stable condition, no apparent complications. The patient may be weightbearing as tolerated to the operative lower extremity.  Range of motion of right knee as tolerated.  Edwin Reel, MD [facility] 8:22 AM

## 2020-01-10 NOTE — Discharge Instructions (Signed)
  Post-operative patient instructions  Knee Arthroscopy   . Ice:  Place intermittent ice or cooler pack over your knee, 30 minutes on and 30 minutes off.  Continue this for the first 72 hours after surgery, then save ice for use after therapy sessions or on more active days.   . Weight:  You may bear weight on your leg as your symptoms allow. . Crutches:  Use crutches (or walker) to assist in walking until told to discontinue by your physical therapist or physician. This will help to reduce pain. . Strengthening:  Perform simple thigh squeezes (isometric quad contractions) and straight leg lifts as you are able (3 sets of 5 to 10 repetitions, 3 times a day).  For the leg lifts, have someone support under your ankle in the beginning until you have increased strength enough to do this on your own.  To help get started on thigh squeezes, place a pillow under your knee and push down on the pillow with back of knee (sometimes easier to do than with your leg fully straight). . Motion:  Perform gentle knee motion as tolerated - this is gentle bending and straightening of the knee. Seated heel slides: you can start by sitting in a chair, remove your brace, and gently slide your heel back on the floor - allowing your knee to bend. Have someone help you straighten your knee (or use your other leg/foot hooked under your ankle.  . Dressing:  Perform 1st dressing change at 2 days postoperative. A moderate amount of blood tinged drainage is to be expected.  So if you bleed through the dressing on the first or second day or if you have fevers, it is fine to change the dressing/check the wounds early and redress wound. Elevate your leg.  If it bleeds through again, or if the incisions are leaking frank blood, please call the office. May change dressing every 1-2 days thereafter to help watch wounds. Can purchase Tegaderm (or 3M Nexcare) water resistant dressings at local pharmacy / [facility]. . Shower:  Light shower is  ok after 2 days.  Please take shower, NO bath. Recover with gauze and ace wrap to help keep wounds protected.   . Pain medication:  A narcotic pain medication has been prescribed.  Take as directed.  Typically you need narcotic pain medication more regularly during the first 3 to 5 days after surgery.  Decrease your use of the medication as the pain improves.  Narcotics can sometimes cause constipation, even after a few doses.  If you have problems with constipation, you can take an over the counter stool softener or light laxative.  If you have persistent problems, please notify your physician's office. . Physical therapy: Additional activity guidelines to be provided by your physician or physical therapist at follow-up visits.  . Driving: Do not recommend driving x 2 weeks post surgical, especially if surgery performed on right side. Should not drive while taking narcotic pain medications. It typically takes at least 2 weeks to restore sufficient neuromuscular function for normal reaction times for driving safety.  . Call [phone] for questions or problems. Evenings you will be forwarded to the hospital operator.  Ask for the orthopaedic physician on call. Please call if you experience:    o Redness, foul smelling, or persistent drainage from the surgical site  o worsening knee pain and swelling not responsive to medication  o any calf pain and or swelling of the lower leg  o temperatures greater than   101.5 F o other questions or concerns   Thank you for allowing [place] to be a part of your care.    Post Anesthesia Home Care Instructions  Activity: Get plenty of rest for the remainder of the day. A responsible individual must stay with you for 24 hours following the procedure.  For the next 24 hours, DO NOT: -Drive a car -Advertising copywriter -Drink alcoholic beverages -Take any medication unless instructed by your physician -Make any legal decisions or sign important  papers.  Meals: Start with liquid foods such as gelatin or soup. Progress to regular foods as tolerated. Avoid greasy, spicy, heavy foods. If nausea and/or vomiting occur, drink only clear liquids until the nausea and/or vomiting subsides. Call your physician if vomiting continues.  Special Instructions/Symptoms: Your throat may feel dry or sore from the anesthesia or the breathing tube placed in your throat during surgery. If this causes discomfort, gargle with warm salt water. The discomfort should disappear within 24 hours.  If you had a scopolamine patch placed behind your ear for the management of post- operative nausea and/or vomiting:  1. The medication in the patch is effective for 72 hours, after which it should be removed.  Wrap patch in a tissue and discard in the trash. Wash hands thoroughly with soap and water. 2. You may remove the patch earlier than 72 hours if you experience unpleasant side effects which may include dry mouth, dizziness or visual disturbances. 3. Avoid touching the patch. Wash your hands with soap and water after contact with the patch.     You received Toradol at 8:25 am. You may repeat ibuprofen any time after 2:25pm If needed. You received Tylenol at 7:10 am. You may repeat Tylenol any time after 1:10pm.

## 2020-01-10 NOTE — H&P (Signed)
PREOPERATIVE H&P  Chief Complaint: left knee medial meniscal tear  HPI: Edwin Chen is a 44 y.o. male who presents for surgical treatment of left knee medial meniscal tear.  He denies any changes in medical history.  Past Medical History:  Diagnosis Date  . Anxiety   . History of kidney stones   . Increased heart rate   . Sleep apnea    uses cpap   History reviewed. No pertinent surgical history. Social History   Socioeconomic History  . Marital status: Single    Spouse name: Not on file  . Number of children: Not on file  . Years of education: Not on file  . Highest education level: Not on file  Occupational History  . Not on file  Tobacco Use  . Smoking status: Former Research scientist (life sciences)  . Smokeless tobacco: Never Used  Substance and Sexual Activity  . Alcohol use: Yes    Comment: occas  . Drug use: Never  . Sexual activity: Yes    Birth control/protection: None  Other Topics Concern  . Not on file  Social History Narrative  . Not on file   Social Determinants of Health   Financial Resource Strain:   . Difficulty of Paying Living Expenses:   Food Insecurity:   . Worried About Charity fundraiser in the Last Year:   . Arboriculturist in the Last Year:   Transportation Needs:   . Film/video editor (Medical):   Marland Kitchen Lack of Transportation (Non-Medical):   Physical Activity:   . Days of Exercise per Week:   . Minutes of Exercise per Session:   Stress:   . Feeling of Stress :   Social Connections:   . Frequency of Communication with Friends and Family:   . Frequency of Social Gatherings with Friends and Family:   . Attends Religious Services:   . Active Member of Clubs or Organizations:   . Attends Archivist Meetings:   Marland Kitchen Marital Status:    History reviewed. No pertinent family history. No Known Allergies Prior to Admission medications   Medication Sig Start Date End Date Taking? Authorizing Provider  escitalopram (LEXAPRO) 10 MG tablet Take 10  mg by mouth daily. 09/16/19  Yes [provider]  pravastatin (PRAVACHOL) 20 MG tablet SMARTSIG:1 Tablet(s) By Mouth Every Evening 09/16/19  Yes [provider]  propranolol (INDERAL) 10 MG tablet Take 10 mg by mouth 3 (three) times daily. 09/18/19  Yes [provider]     Positive ROS: All other systems have been reviewed and were otherwise negative with the exception of those mentioned in the HPI and as above.  Physical Exam: General: Alert, no acute distress Cardiovascular: No pedal edema Respiratory: No cyanosis, no use of accessory musculature GI: abdomen soft Skin: No lesions in the area of chief complaint Neurologic: Sensation intact distally Psychiatric: Patient is competent for consent with normal mood and affect Lymphatic: no lymphedema  MUSCULOSKELETAL: exam stable  Assessment: left knee medial meniscal tear  Plan: Plan for Procedure(s): LEFT KNEE ARTHROSCOPY WITH PARTIAL MEDIAL MENISCECTOMY  The risks benefits and alternatives were discussed with the patient including but not limited to the risks of nonoperative treatment, versus surgical intervention including infection, bleeding, nerve injury,  blood clots, cardiopulmonary complications, morbidity, mortality, among others, and they were willing to proceed.   Preoperative templating of the joint replacement has been completed, documented, and submitted to the Operating Room personnel in order to optimize intra-operative equipment management.  Glee Arvin, MD 01/10/2020 6:38 AM

## 2020-01-10 NOTE — Transfer of Care (Signed)
Immediate Anesthesia Transfer of Care Note  Patient: Edwin Chen  Procedure(s) Performed: LEFT KNEE ARTHROSCOPY WITH PARTIAL MEDIAL MENISCECTOMY (Left Knee)  Patient Location: PACU  Anesthesia Type:General  Level of Consciousness: awake, alert  and oriented  Airway & Oxygen Therapy: Patient Spontanous Breathing and Patient connected to face mask oxygen  Post-op Assessment: Report given to RN and Post -op Vital signs reviewed and stable  Post vital signs: Reviewed and stable  Last Vitals:  Vitals Value Taken Time  BP 130/92 01/10/20 0828  Temp    Pulse 92 01/10/20 0829  Resp 8 01/10/20 0829  SpO2 99 % 01/10/20 0829  Vitals shown include unvalidated device data.  Last Pain:  Vitals:   01/10/20 0626  TempSrc: Oral  PainSc: 0-No pain      Patients Stated Pain Goal: 3 (01/10/20 0071)  Complications: No complications documented.

## 2020-01-11 ENCOUNTER — Encounter (HOSPITAL_BASED_OUTPATIENT_CLINIC_OR_DEPARTMENT_OTHER): Payer: Self-pay | Admitting: Orthopaedic Surgery

## 2020-01-17 ENCOUNTER — Encounter: Payer: Self-pay | Admitting: Orthopaedic Surgery

## 2020-01-17 ENCOUNTER — Ambulatory Visit (INDEPENDENT_AMBULATORY_CARE_PROVIDER_SITE_OTHER): Payer: 59 | Admitting: Orthopaedic Surgery

## 2020-01-17 DIAGNOSIS — S83242A Other tear of medial meniscus, current injury, left knee, initial encounter: Secondary | ICD-10-CM

## 2020-01-17 NOTE — Progress Notes (Signed)
   Post-Op Visit Note   Patient: Edwin Chen           Date of Birth: [birth year]           MRN: [id number] Visit Date: 01/17/2020 PCP: Clayborn Heron, MD   Assessment & Plan:  Chief Complaint:  Chief Complaint  Patient presents with  . Left Knee - Pain   Visit Diagnoses:  1. Acute medial meniscus tear of left knee, initial encounter     Plan: Edwin Chen is a 44 year old gentleman who is 1 week status post left knee arthroscopy with partial medial meniscectomy.  Overall he is doing well.  Surgical incisions are healed.  He does have some mild ecchymosis and a small joint effusion.  His range of motion is improving quite nicely.  He is ambulating without any assistive devices.  His pain is mild.  I reviewed the arthroscopic pictures with him in detail today.  Questions were answered to his satisfaction.  We had a discussion about returning back to work and he feels that he should be able to go back this coming Monday.  Work note was provided.  I would like to recheck him in 3 weeks.  We provided him with home exercises today.  Follow-Up Instructions: Return in about 3 weeks (around 02/07/2020).   Orders:  No orders of the defined types were placed in this encounter.  No orders of the defined types were placed in this encounter.   Imaging: No results found.  PMFS History: Patient Active Problem List   Diagnosis Date Noted  . Acute medial meniscus tear of left knee 12/13/2019   Past Medical History:  Diagnosis Date  . Anxiety   . History of kidney stones   . Increased heart rate   . Sleep apnea    uses cpap    History reviewed. No pertinent family history.  Past Surgical History:  Procedure Laterality Date  . KNEE ARTHROSCOPY WITH MEDIAL MENISECTOMY Left 01/10/2020   Procedure: LEFT KNEE ARTHROSCOPY WITH PARTIAL MEDIAL MENISCECTOMY;  Surgeon: Tarry Kos, MD;  Location: [facility];  Service: Orthopedics;  Laterality: Left;   Social History    Occupational History  . Not on file  Tobacco Use  . Smoking status: Former Games developer  . Smokeless tobacco: Never Used  Substance and Sexual Activity  . Alcohol use: Yes    Comment: occas  . Drug use: Never  . Sexual activity: Yes    Birth control/protection: None

## 2020-02-07 ENCOUNTER — Ambulatory Visit (INDEPENDENT_AMBULATORY_CARE_PROVIDER_SITE_OTHER): Payer: 59 | Admitting: Orthopaedic Surgery

## 2020-02-07 ENCOUNTER — Encounter: Payer: Self-pay | Admitting: Orthopaedic Surgery

## 2020-02-07 DIAGNOSIS — S83242A Other tear of medial meniscus, current injury, left knee, initial encounter: Secondary | ICD-10-CM

## 2020-02-07 NOTE — Progress Notes (Signed)
Patient ID: Edwin Chen, male   DOB: 11-Jun-1976, 44 y.o.   MRN: [id number]  Edwin Chen is 4 weeks status post partial medial meniscectomy.  He has returned back to work.  Reports no swelling.  He has been doing some of his exercises.  Overall he is doing fine.  He still states that he has some soreness on the medial side.  Surgical scars are fully healed.  No joint effusion.  Excellent range of motion.  Slight tenderness along the medial joint line.  Overall he is doing well.  I recommend that he continue with the home exercises for strengthening.  He will follow-up in about a month if he does not notice significant provement over the next month.  Otherwise he can follow-up as needed.

## 2020-05-14 ENCOUNTER — Ambulatory Visit (INDEPENDENT_AMBULATORY_CARE_PROVIDER_SITE_OTHER): Payer: 59 | Admitting: Orthopaedic Surgery

## 2020-05-14 ENCOUNTER — Other Ambulatory Visit: Payer: Self-pay

## 2020-05-14 ENCOUNTER — Encounter: Payer: Self-pay | Admitting: Orthopaedic Surgery

## 2020-05-14 DIAGNOSIS — S83242A Other tear of medial meniscus, current injury, left knee, initial encounter: Secondary | ICD-10-CM | POA: Diagnosis not present

## 2020-05-14 MED ORDER — DICLOFENAC SODIUM 75 MG PO TBEC
75.0000 mg | DELAYED_RELEASE_TABLET | Freq: Two times a day (BID) | ORAL | 2 refills | Status: AC
Start: 2020-05-14 — End: ?

## 2020-05-14 NOTE — Progress Notes (Signed)
   Office Visit Note   Patient: Edwin Chen           Date of Birth: [birth year]           MRN: [id number] Visit Date: 05/14/2020              Requested by: Clayborn Heron, MD [address] [place],  [place] [address] PCP: Clayborn Heron, MD   Assessment & Plan: Visit Diagnoses:  1. Acute medial meniscus tear of left knee, initial encounter     Plan: Overall his symptoms and findings are consistent with postsurgical changes.  And explained to the patient that the scar tenderness will resolve the tightness he feels in the front of the knee is likely from scarring of the fat pad which will also resolve.  I recommend RICE when appropriate.  He is to work on improving knee flexion on his own.  I taught him exercises.  Follow-up as needed.  Follow-Up Instructions: Return if symptoms worsen or fail to improve.   Orders:  No orders of the defined types were placed in this encounter.  Meds ordered this encounter  Medications  . diclofenac (VOLTAREN) 75 MG EC tablet    Sig: Take 1 tablet (75 mg total) by mouth 2 (two) times daily.    Dispense:  30 tablet    Refill:  2      Procedures: No procedures performed   Clinical Data: No additional findings.   Subjective: Chief Complaint  Patient presents with  . Left Knee - Pain    Edwin Chen is 4 months status post left knee scope for medial meniscal tear.  He still feels tightness when flexing his knee.  Overall he is doing well.  He feels some incisional pain over the anteromedial incision.   Review of Systems   Objective: Vital Signs: There were no vitals taken for this visit.  Physical Exam  Ortho Exam Left knee shows a trace effusion.  Surgical scars are all fully healed.  He has some tenderness of the anterior medial incision.  Range of motion is mildly limited. Specialty Comments:  No specialty comments available.  Imaging: No results found.   PMFS History: Patient Active Problem List   Diagnosis Date  Noted  . Acute medial meniscus tear of left knee 12/13/2019   Past Medical History:  Diagnosis Date  . Anxiety   . History of kidney stones   . Increased heart rate   . Sleep apnea    uses cpap    History reviewed. No pertinent family history.  Past Surgical History:  Procedure Laterality Date  . KNEE ARTHROSCOPY WITH MEDIAL MENISECTOMY Left 01/10/2020   Procedure: LEFT KNEE ARTHROSCOPY WITH PARTIAL MEDIAL MENISCECTOMY;  Surgeon: Tarry Kos, MD;  Location: [facility];  Service: Orthopedics;  Laterality: Left;   Social History   Occupational History  . Not on file  Tobacco Use  . Smoking status: Former Games developer  . Smokeless tobacco: Never Used  Substance and Sexual Activity  . Alcohol use: Yes    Comment: occas  . Drug use: Never  . Sexual activity: Yes    Birth control/protection: None

## 2021-03-01 IMAGING — MR MR KNEE*L* W/O CM
5 of 7 series · 22 of 40 positions shown · non-contrast
Comparison: Radiographs 11/09/2019

CLINICAL DATA: Medial knee pain for 3 weeks. No relief from steroid
injections. No acute injury or prior relevant surgery.

EXAM:
MRI OF THE LEFT KNEE WITHOUT CONTRAST
TECHNIQUE: Multiplanar, multisequence MR imaging of the knee was performed. No
intravenous contrast was administered.

[Series 3: T2 fat-sat · axial · 4.0mm · 0.50mm/px · z∈[-65,+65]mm · 5 of 27 slices shown (1 of 2)]
[im 1/27]
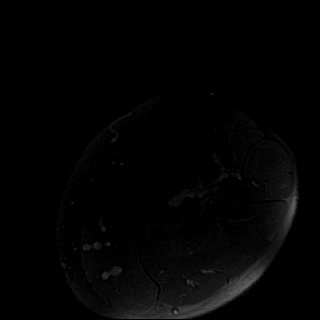
[im 7/27]
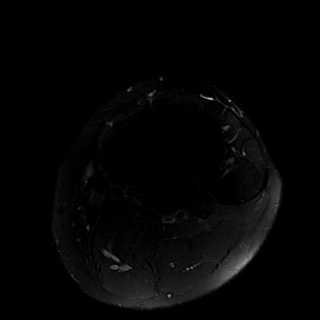
[im 14/27]
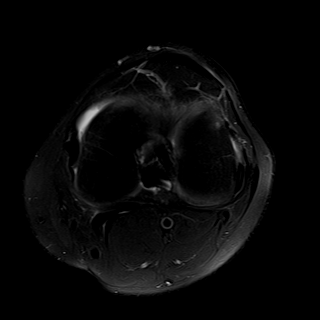
[im 20/27]
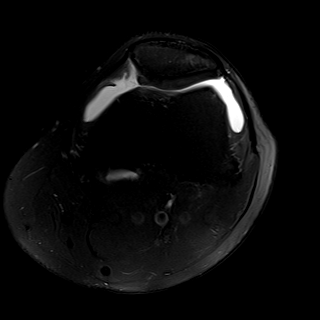
[im 27/27]
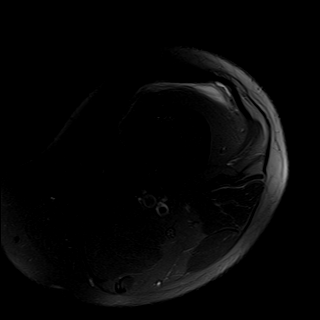

[Series 5: T2 fat-sat · coronal · 4.0mm · 0.29mm/px · 1 of 26 slices shown (2 of 2)]
[im 1/26]
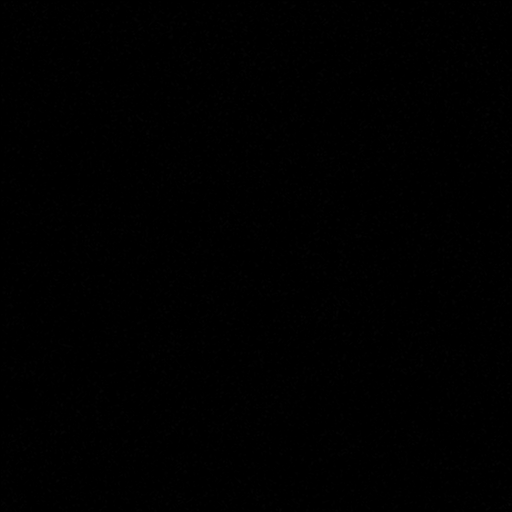

[Series 7: PD fat-sat · sagittal · 3.0mm · 0.29mm/px · 7 of 30 slices shown (1 of 3)]
[im 1/30]
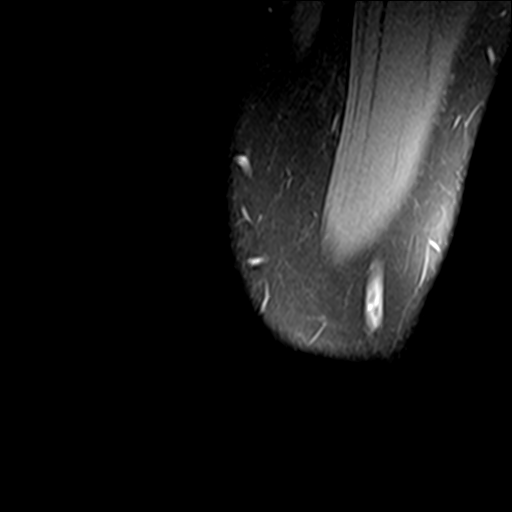
[im 5/30]
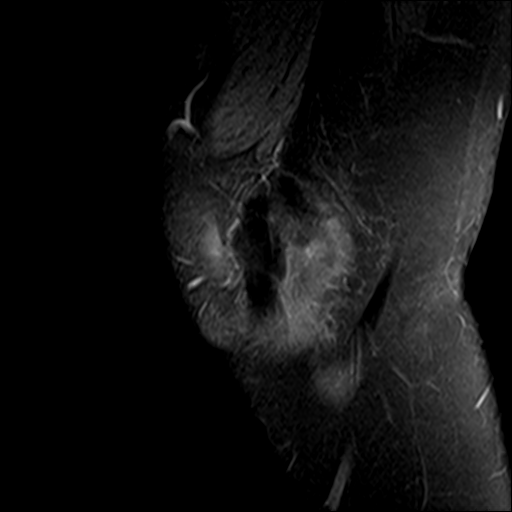
[im 10/30]
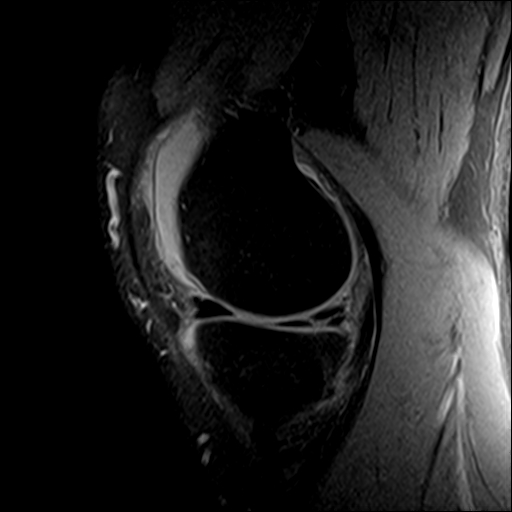
[im 15/30]
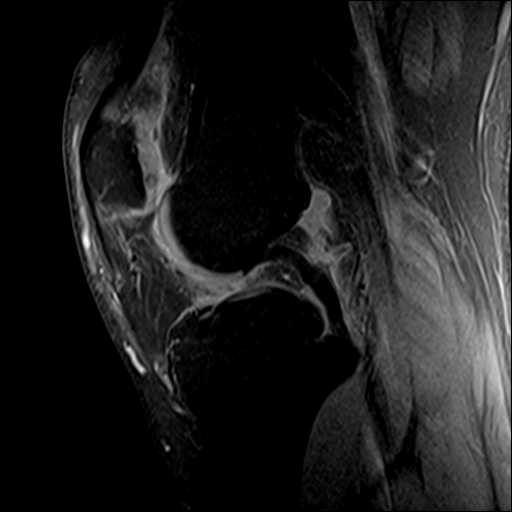
[im 20/30]
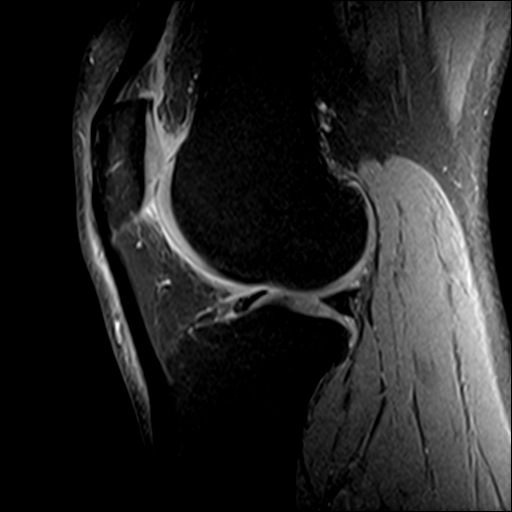
[im 25/30]
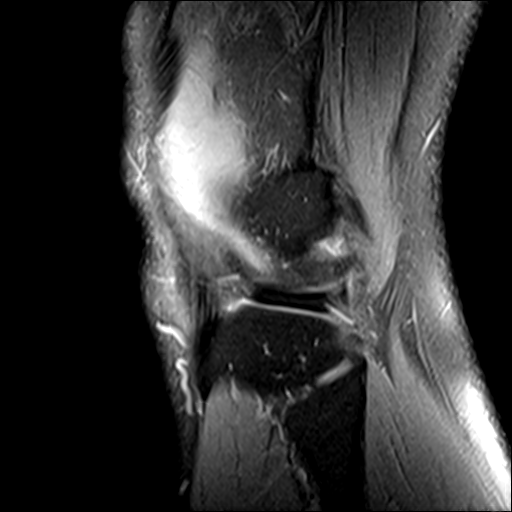
[im 30/30]
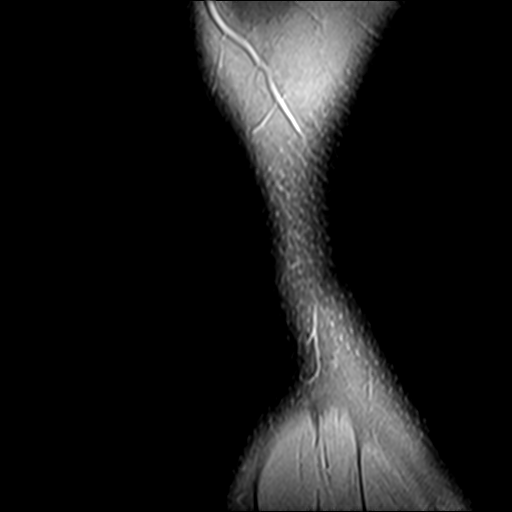

[Series 8: PD fat-sat · coronal · 3.0mm · 0.29mm/px · 7 of 32 slices shown (2 of 3)]
[im 1/32]
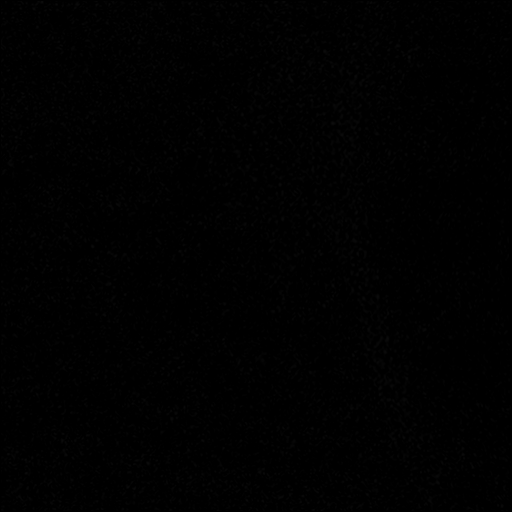
[im 6/32]
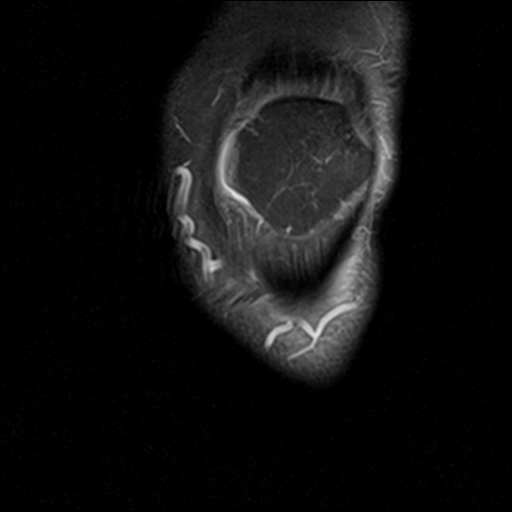
[im 11/32]
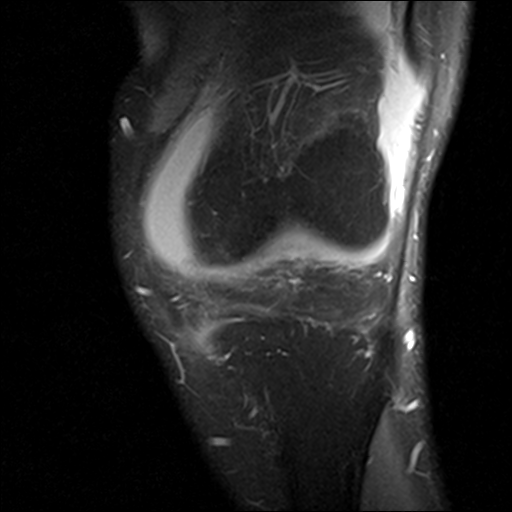
[im 16/32]
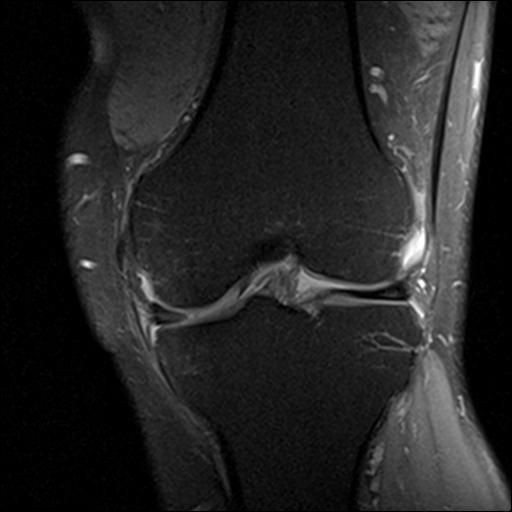
[im 21/32]
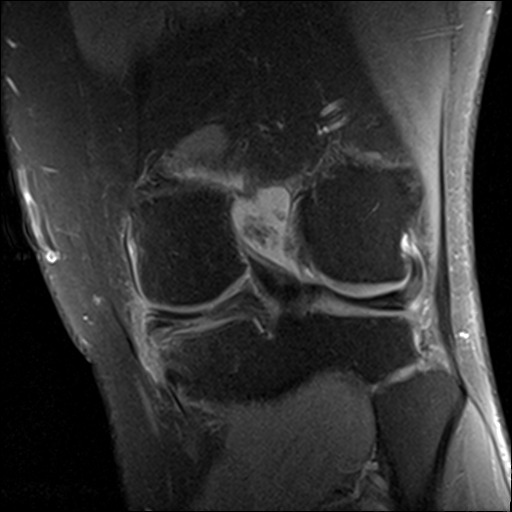
[im 26/32]
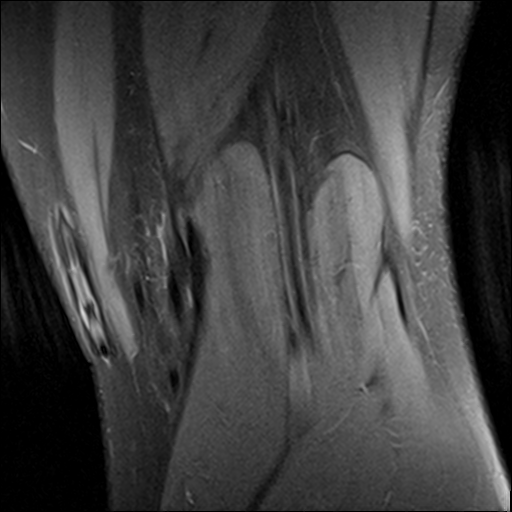
[im 32/32]
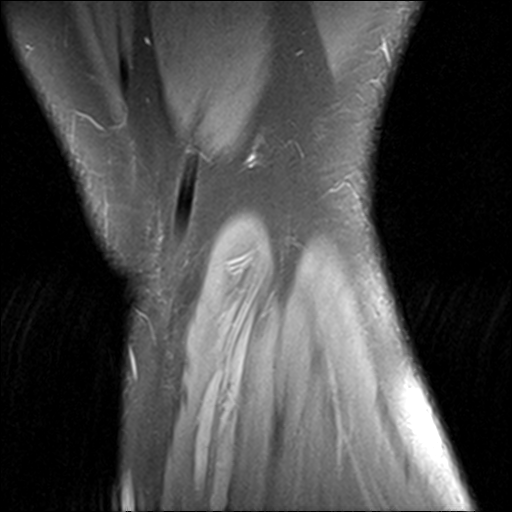

[Series 9: PD fat-sat · oblique · 2.3mm · 0.29mm/px · 2 of 11 slices shown (3 of 3)]
[im 1/11]
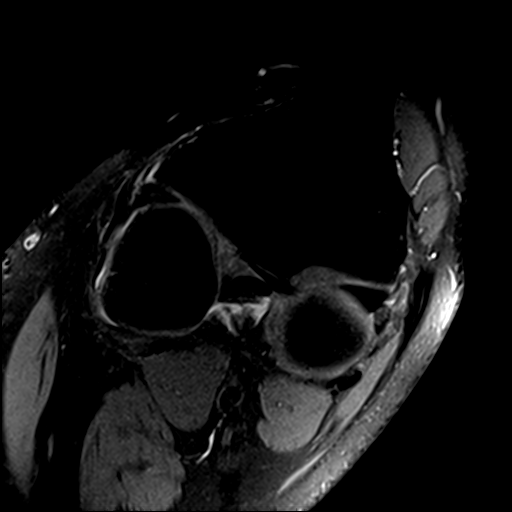
[im 11/11]
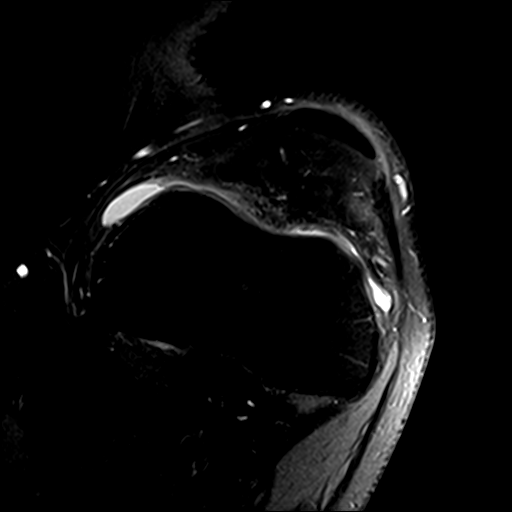

[22 of 40 positions shown; findings below may reference images not displayed]

FINDINGS: MENISCI

Medial meniscus: Irregular, predominately oblique tear of the
posterior horn extends to the inferior articular surface and is best
seen on the sagittal images. There is mild peripheral extension of
the tear into the meniscal body. The meniscal root is intact. No
centrally displaced meniscal fragment.

Lateral meniscus:  Intact with normal morphology.

LIGAMENTS

Cruciates:  Intact.

Collaterals: Intact. There is a small amount of ill-defined fluid
within the pes anserine bursa.

CARTILAGE

Patellofemoral:  Preserved.

Medial: Preserved. There is mild reactive edema peripherally in the
medial tibial plateau.

Lateral:  Preserved.

MISCELLANEOUS

Joint:  Moderate-sized joint effusion.

Popliteal Fossa:  Unremarkable. No significant Baker's cyst.

Extensor Mechanism:  Intact.

Bones:  No acute or significant extra-articular osseous findings.

Other: No other significant periarticular soft tissue findings.
IMPRESSION: 1. Irregular, predominately oblique tear of the posterior horn and
body of the medial meniscus. No centrally displaced meniscal
fragment.
2. The lateral meniscus, cruciate and collateral ligaments are
intact.
3. Moderate-sized joint effusion and mild pes anserine bursitis.
4. No acute osseous findings or significant arthropathic changes.

## 2023-11-30 ENCOUNTER — Other Ambulatory Visit: Payer: Self-pay | Admitting: Medical Genetics

## 2023-12-14 ENCOUNTER — Other Ambulatory Visit (HOSPITAL_COMMUNITY)
Admission: RE | Admit: 2023-12-14 | Discharge: 2023-12-14 | Disposition: A | Discharge status: Home / Self Care | Payer: Self-pay | Source: Ambulatory Visit | Attending: Medical Genetics | Admitting: Medical Genetics

## 2023-12-24 LAB — GENECONNECT MOLECULAR SCREEN: Genetic Analysis Overall Interpretation: NEGATIVE
